# Patient Record
Sex: Male | Born: 1945 | Race: Black or African American | Hispanic: No | Marital: Married | Smoking: Former smoker
Health system: Southern US, Community
[De-identification: ages and names within clinical notes are randomized; demographics above are authoritative.]

## PROBLEM LIST (undated history)

## (undated) DIAGNOSIS — R011 Cardiac murmur, unspecified: Secondary | ICD-10-CM

## (undated) DIAGNOSIS — E785 Hyperlipidemia, unspecified: Secondary | ICD-10-CM

## (undated) DIAGNOSIS — I739 Peripheral vascular disease, unspecified: Secondary | ICD-10-CM

## (undated) DIAGNOSIS — N183 Chronic kidney disease, stage 3 unspecified: Secondary | ICD-10-CM

## (undated) DIAGNOSIS — I10 Essential (primary) hypertension: Secondary | ICD-10-CM

## (undated) DIAGNOSIS — I350 Nonrheumatic aortic (valve) stenosis: Secondary | ICD-10-CM

## (undated) HISTORY — PX: NECK SURGERY: SHX720

## (undated) HISTORY — PX: COLONOSCOPY: SHX174

## (undated) HISTORY — PX: FEMORAL BYPASS: SHX50

## (undated) HISTORY — PX: BLEPHAROPLASTY: SUR158

---

## 2007-12-31 ENCOUNTER — Inpatient Hospital Stay: Payer: Self-pay | Admitting: Internal Medicine

## 2012-01-22 DIAGNOSIS — I639 Cerebral infarction, unspecified: Secondary | ICD-10-CM

## 2012-01-22 HISTORY — DX: Cerebral infarction, unspecified: I63.9

## 2012-08-31 ENCOUNTER — Emergency Department: Payer: Self-pay | Admitting: Emergency Medicine

## 2012-08-31 LAB — COMPREHENSIVE METABOLIC PANEL
Albumin: 3.6 g/dL (ref 3.4–5.0)
Alkaline Phosphatase: 119 U/L (ref 50–136)
BUN: 8 mg/dL (ref 7–18)
Bilirubin,Total: 0.3 mg/dL (ref 0.2–1.0)
Chloride: 106 mmol/L (ref 98–107)
Co2: 26 mmol/L (ref 21–32)
EGFR (African American): >60
Osmolality: 272 (ref 275–301)
Potassium: 3.8 mmol/L (ref 3.5–5.1)
SGOT(AST): 17 U/L (ref 15–37)
Total Protein: 7.3 g/dL (ref 6.4–8.2)

## 2012-08-31 LAB — URINALYSIS, COMPLETE
Bilirubin,UR: NEGATIVE
Nitrite: NEGATIVE
Protein: NEGATIVE
RBC,UR: 2 /HPF (ref 0–5)
Specific Gravity: 1.018 (ref 1.003–1.030)
Squamous Epithelial: <1
WBC UR: <1 /HPF (ref 0–5)

## 2012-08-31 LAB — CBC
HCT: 44.5 % (ref 40.0–52.0)
MCH: 28.8 pg (ref 26.0–34.0)
MCV: 84 fL (ref 80–100)
WBC: 9.9 10*3/uL (ref 3.8–10.6)

## 2012-08-31 LAB — TROPONIN I: Troponin-I: <0.02 ng/mL

## 2012-11-06 ENCOUNTER — Emergency Department: Payer: Self-pay | Admitting: Emergency Medicine

## 2016-10-17 ENCOUNTER — Other Ambulatory Visit: Payer: Self-pay | Admitting: Ophthalmology

## 2016-10-17 ENCOUNTER — Other Ambulatory Visit: Payer: Self-pay

## 2016-10-17 DIAGNOSIS — H04031 Chronic enlargement of right lacrimal gland: Secondary | ICD-10-CM

## 2016-10-24 ENCOUNTER — Ambulatory Visit
Admission: RE | Admit: 2016-10-24 | Discharge: 2016-10-24 | Disposition: A | Discharge status: Home / Self Care | Payer: Medicare Other | Source: Ambulatory Visit | Attending: Ophthalmology | Admitting: Ophthalmology

## 2016-10-24 DIAGNOSIS — H04031 Chronic enlargement of right lacrimal gland: Secondary | ICD-10-CM | POA: Insufficient documentation

## 2016-10-24 DIAGNOSIS — I63532 Cerebral infarction due to unspecified occlusion or stenosis of left posterior cerebral artery: Secondary | ICD-10-CM | POA: Diagnosis not present

## 2016-10-24 LAB — POCT I-STAT CREATININE: CREATININE: 1.4 mg/dL — AB (ref 0.61–1.24)

## 2016-10-24 MED ORDER — GADOBENATE DIMEGLUMINE 529 MG/ML IV SOLN
18.0000 mL | Freq: Once | INTRAVENOUS | Status: AC | PRN
  Administered 2016-10-24: 18 mL via INTRAVENOUS

## 2018-08-07 IMAGING — MR MR ORBITS WO/W CM
4 of 7 series · 19 of 48 positions shown · IV contrast (18mL MULTIHANCE)
Comparison: Brain MRI 08/31/2012.

CLINICAL DATA: 71-year-old male with right side enlarged lacrimal
gland. I drainage. Suspected tumor.

EXAM:
MRI OF THE ORBITS WITHOUT AND WITH CONTRAST
TECHNIQUE: Multiplanar, multisequence MR imaging of the orbits was performed
both before and after the administration of intravenous contrast.
CONTRAST:  18mL MULTIHANCE GADOBENATE DIMEGLUMINE 529 MG/ML IV SOLN

[Series 2: T1 · sagittal · 5.0mm · 0.45mm/px · 3 of 25 slices shown (1 of 2)]
[im 5/25]
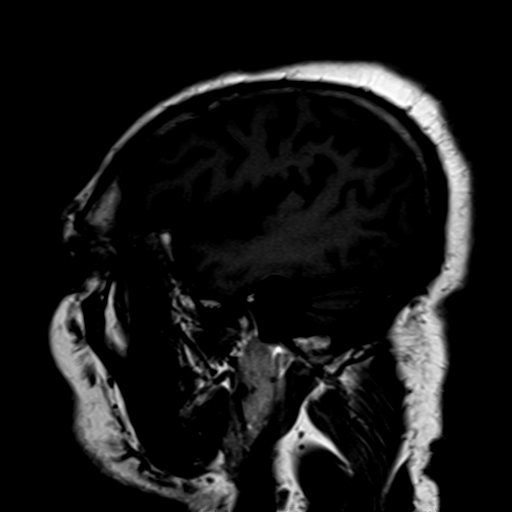
[im 13/25]
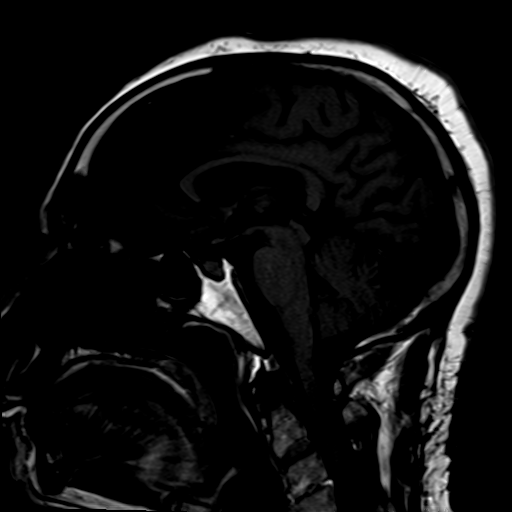
[im 21/25]
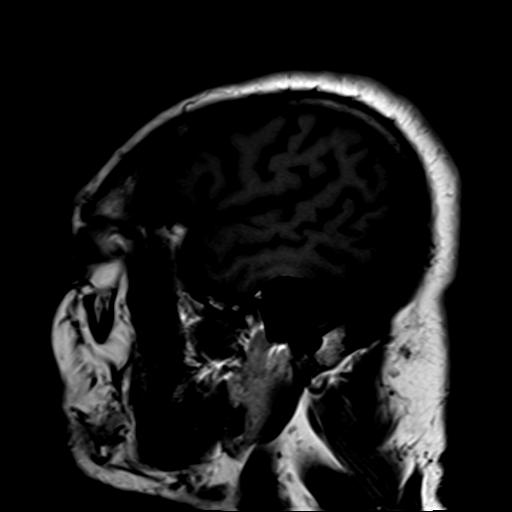

[Series 4: T2 fat-sat · coronal · 3.0mm · 0.35mm/px · 7 of 23 slices shown (1 of 2)]
[im 1/23]
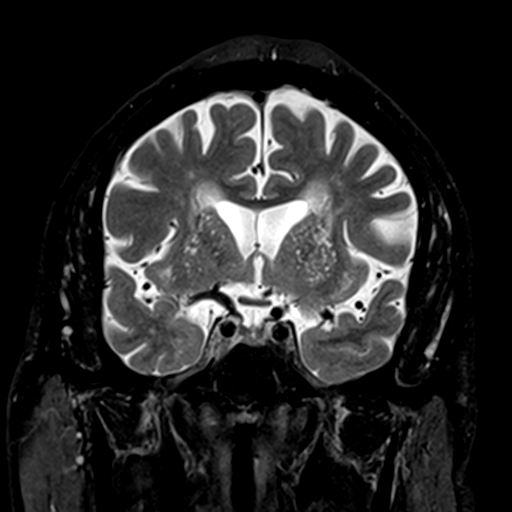
[im 4/23]
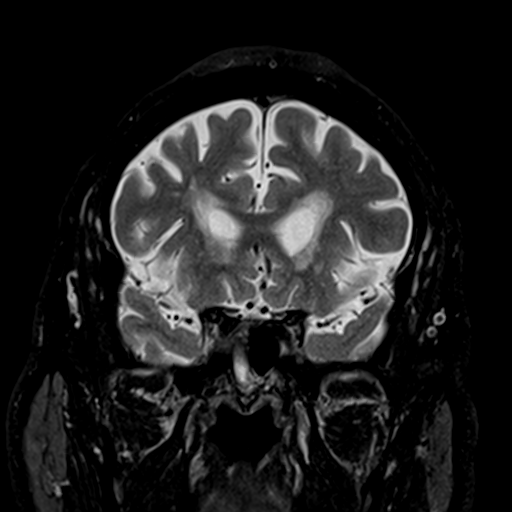
[im 8/23]
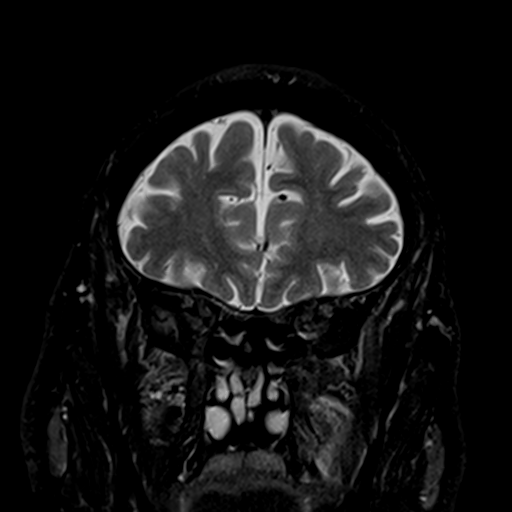
[im 12/23]
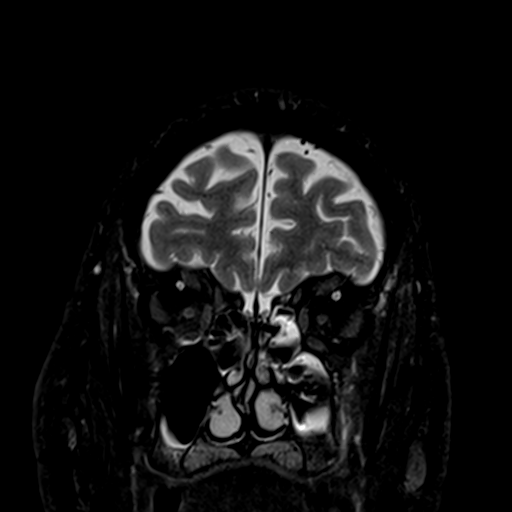
[im 15/23]
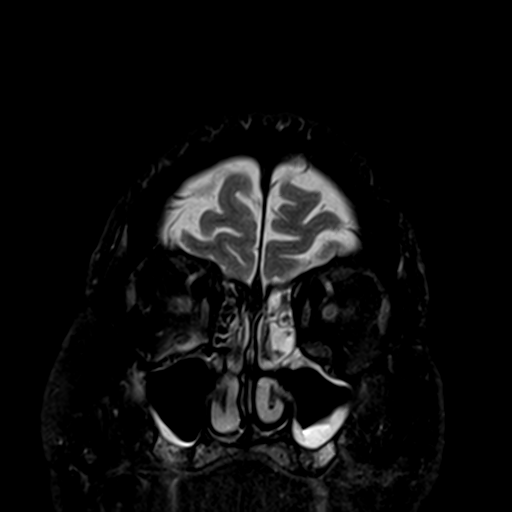
[im 19/23]
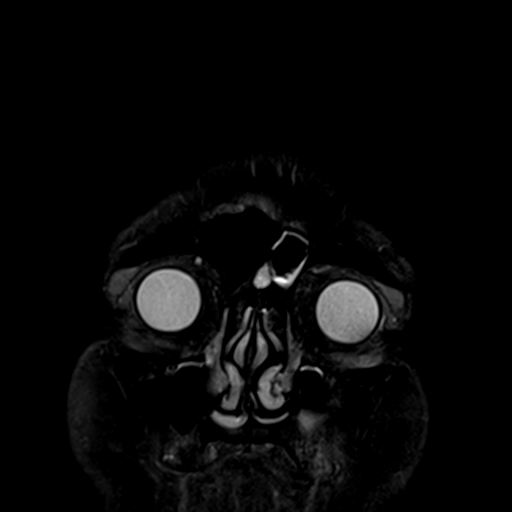
[im 23/23]
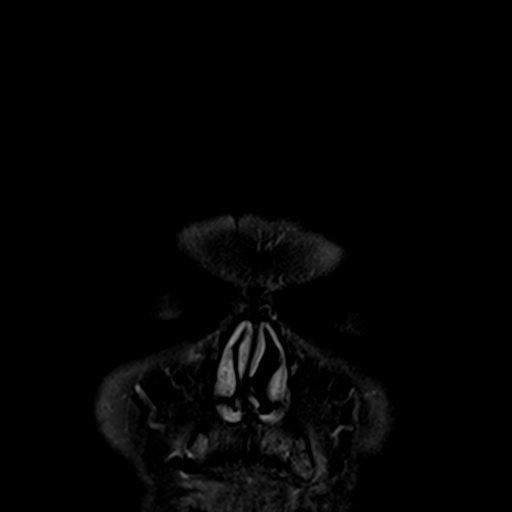

[Series 6: T1 · coronal · 3.0mm · 0.39mm/px · 3 of 25 slices shown (2 of 2)]
[im 4/25]
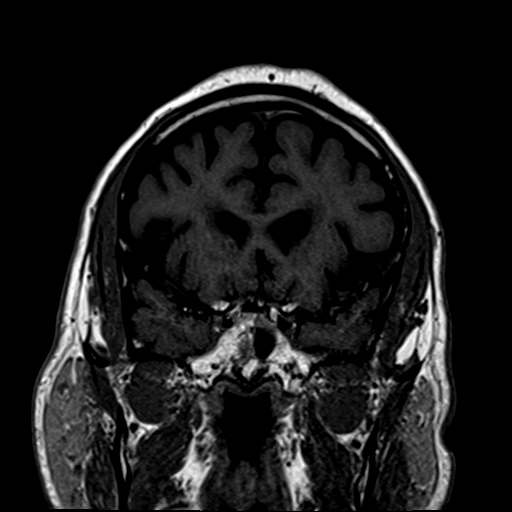
[im 14/25]
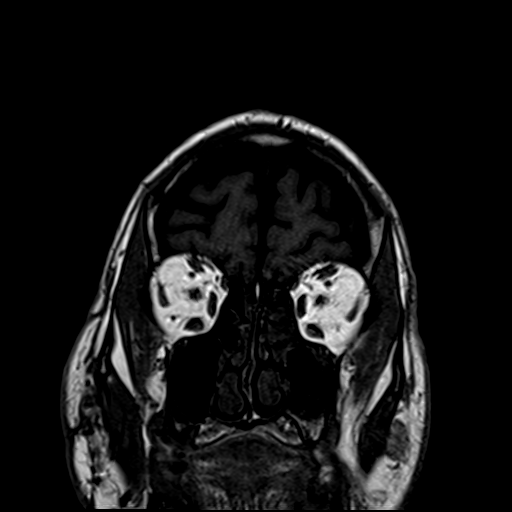
[im 21/25]
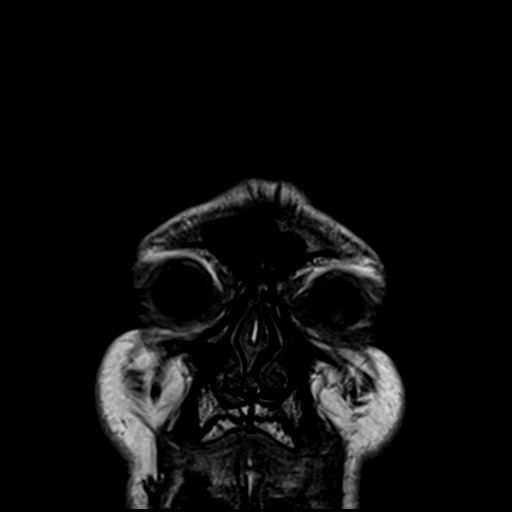

[Series 8: T2 fat-sat · axial · 3.0mm · 0.35mm/px · z∈[-51,+8]mm · 6 of 19 slices shown (2 of 2)]
[im 1/19]
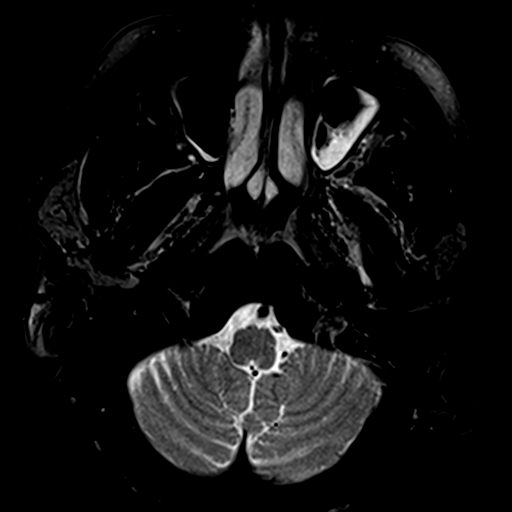
[im 4/19]
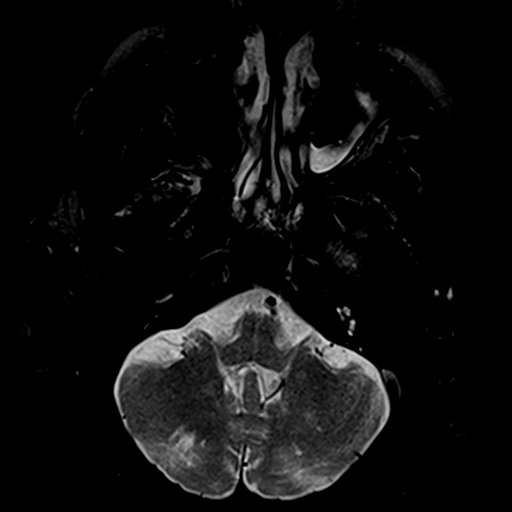
[im 8/19]
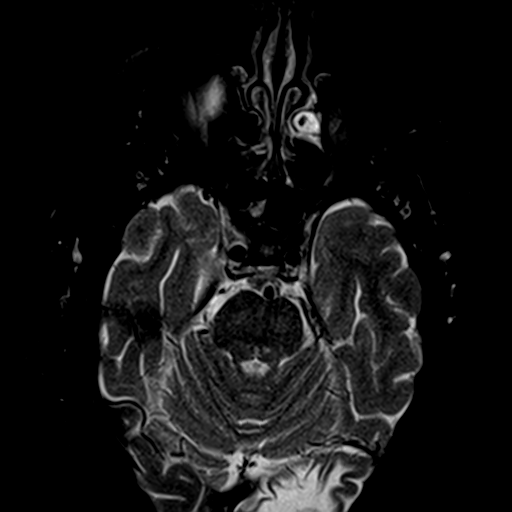
[im 11/19]
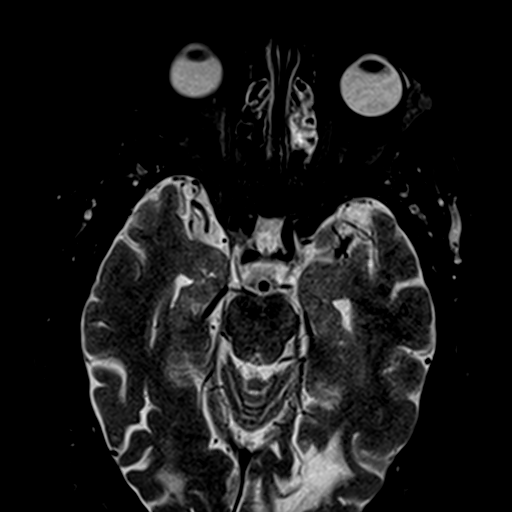
[im 15/19]
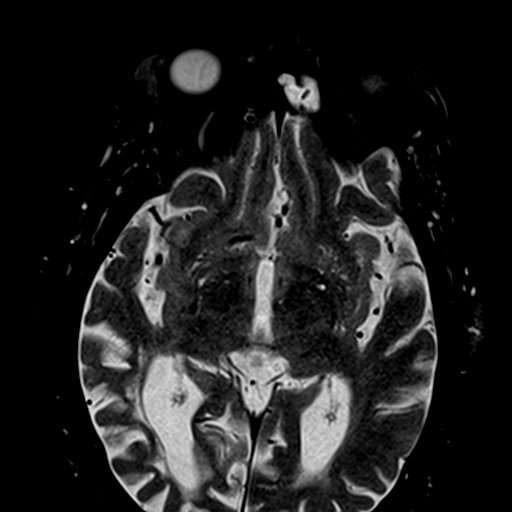
[im 19/19]
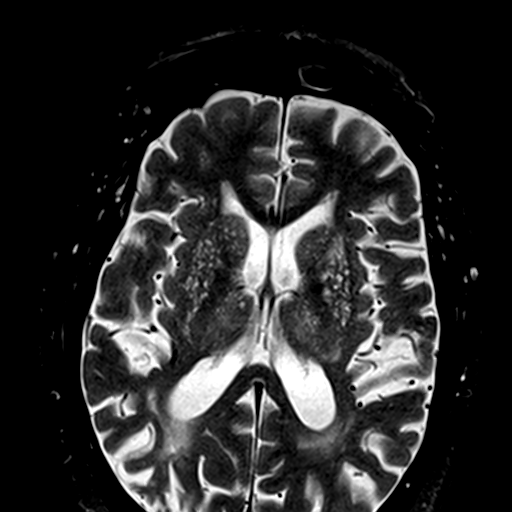

[19 of 48 positions shown; findings below may reference images not displayed]

FINDINGS: Both lacrimal glands appear mildly enlarged, but also appear
symmetric (series 8, image 14 on the right and image 13 on the left)
and similar in size and configuration to that visible on the brain
MRI of 0102. There is no periorbital or intraorbital inflammation or
edema. The globes appear symmetric and within normal limits.

Following contrast bilateral lacrimal gland enhancement is symmetric
and within normal limits. The extraocular muscles and optic nerves
also appear symmetric and normal. No abnormal enhancement
identified.

Optic chiasm, visible pituitary gland, suprasellar cistern, and
cavernous sinus appear normal. Major intracranial vascular flow
voids appear stable since 0102.

Stable visualized brain parenchyma since 0102, including left
occipital lobe encephalomalacia and small chronic infarcts in both
cerebellar hemispheres. No dural thickening.

Partially visible cervical spine postoperative changes. Normal bone
marrow signal. Moderate left greater than right paranasal sinus
mucosal thickening is new since 0102. Visible scalp and face soft
tissues appear negative.
IMPRESSION: 1. The lacrimal glands demonstrate mild to moderate symmetric
enlargement, and probably have not significantly changed since 0102.
Lacrimal gland enhancement is within normal limits.
2. No orbital tumor or acute inflammation.
3. Stable visualized brain including chronic left PCA territory
infarct.

## 2019-11-24 ENCOUNTER — Encounter: Payer: Self-pay | Admitting: Ophthalmology

## 2019-11-24 ENCOUNTER — Other Ambulatory Visit: Payer: Self-pay

## 2019-11-25 ENCOUNTER — Other Ambulatory Visit
Admission: RE | Admit: 2019-11-25 | Discharge: 2019-11-25 | Disposition: A | Discharge status: Home / Self Care | Payer: Medicare Other | Source: Ambulatory Visit | Attending: Ophthalmology | Admitting: Ophthalmology

## 2019-11-25 DIAGNOSIS — Z20822 Contact with and (suspected) exposure to covid-19: Secondary | ICD-10-CM | POA: Insufficient documentation

## 2019-11-25 DIAGNOSIS — Z01812 Encounter for preprocedural laboratory examination: Secondary | ICD-10-CM | POA: Diagnosis present

## 2019-11-25 LAB — SARS CORONAVIRUS 2 (TAT 6-24 HRS): SARS Coronavirus 2: NEGATIVE

## 2019-11-25 NOTE — Discharge Instructions (Signed)

## 2019-11-29 ENCOUNTER — Encounter: Payer: Self-pay | Admitting: Ophthalmology

## 2019-11-29 ENCOUNTER — Other Ambulatory Visit: Payer: Self-pay

## 2019-11-29 ENCOUNTER — Ambulatory Visit
Admission: RE | Admit: 2019-11-29 | Discharge: 2019-11-29 | Disposition: A | Discharge status: Home / Self Care | Payer: Medicare Other | Attending: Ophthalmology | Admitting: Ophthalmology

## 2019-11-29 ENCOUNTER — Encounter
Admission: RE | Disposition: A | Discharge status: Home / Self Care | Payer: Self-pay | Source: Home / Self Care | Attending: Ophthalmology

## 2019-11-29 ENCOUNTER — Ambulatory Visit: Payer: Medicare Other | Admitting: Anesthesiology

## 2019-11-29 DIAGNOSIS — Z87891 Personal history of nicotine dependence: Secondary | ICD-10-CM | POA: Diagnosis not present

## 2019-11-29 DIAGNOSIS — H2511 Age-related nuclear cataract, right eye: Secondary | ICD-10-CM | POA: Diagnosis present

## 2019-11-29 DIAGNOSIS — H401111 Primary open-angle glaucoma, right eye, mild stage: Secondary | ICD-10-CM | POA: Insufficient documentation

## 2019-11-29 DIAGNOSIS — Z7982 Long term (current) use of aspirin: Secondary | ICD-10-CM | POA: Diagnosis not present

## 2019-11-29 DIAGNOSIS — Z79899 Other long term (current) drug therapy: Secondary | ICD-10-CM | POA: Diagnosis not present

## 2019-11-29 HISTORY — PX: CATARACT EXTRACTION W/PHACO: SHX586

## 2019-11-29 HISTORY — DX: Chronic kidney disease, stage 3 unspecified: N18.30

## 2019-11-29 HISTORY — DX: Hyperlipidemia, unspecified: E78.5

## 2019-11-29 HISTORY — DX: Nonrheumatic aortic (valve) stenosis: I35.0

## 2019-11-29 HISTORY — DX: Cardiac murmur, unspecified: R01.1

## 2019-11-29 HISTORY — DX: Peripheral vascular disease, unspecified: I73.9

## 2019-11-29 HISTORY — DX: Essential (primary) hypertension: I10

## 2019-11-29 SURGERY — PHACOEMULSIFICATION, CATARACT, WITH IOL INSERTION
Anesthesia: Monitor Anesthesia Care | Site: Eye | Laterality: Right

## 2019-11-29 MED ORDER — ACETAMINOPHEN 160 MG/5ML PO SOLN: 325.0000 mg | Freq: Once | ORAL | Status: DC

## 2019-11-29 MED ORDER — SODIUM HYALURONATE 23 MG/ML IO SOLN
INTRAOCULAR | Status: DC | PRN
  Administered 2019-11-29: 0.6 mL via INTRAOCULAR

## 2019-11-29 MED ORDER — LACTATED RINGERS IV SOLN: INTRAVENOUS | Status: DC

## 2019-11-29 MED ORDER — FENTANYL CITRATE (PF) 100 MCG/2ML IJ SOLN
INTRAMUSCULAR | Status: DC | PRN
  Administered 2019-11-29: 25 ug via INTRAVENOUS

## 2019-11-29 MED ORDER — TETRACAINE HCL 0.5 % OP SOLN
1.0000 [drp] | OPHTHALMIC | Status: DC | PRN
  Administered 2019-11-29 (×3): 1 [drp] via OPHTHALMIC

## 2019-11-29 MED ORDER — EPINEPHRINE PF 1 MG/ML IJ SOLN
INTRAOCULAR | Status: DC | PRN
  Administered 2019-11-29: 83 mL via OPHTHALMIC

## 2019-11-29 MED ORDER — LIDOCAINE HCL (PF) 2 % IJ SOLN
INTRAOCULAR | Status: DC | PRN
  Administered 2019-11-29: 1 mL via INTRAOCULAR

## 2019-11-29 MED ORDER — MOXIFLOXACIN HCL 0.5 % OP SOLN
OPHTHALMIC | Status: DC | PRN
  Administered 2019-11-29: 0.2 mL via OPHTHALMIC

## 2019-11-29 MED ORDER — MIDAZOLAM HCL 2 MG/2ML IJ SOLN
INTRAMUSCULAR | Status: DC | PRN
  Administered 2019-11-29: 1 mg via INTRAVENOUS

## 2019-11-29 MED ORDER — ARMC OPHTHALMIC DILATING DROPS
1.0000 "application " | OPHTHALMIC | Status: DC | PRN
  Administered 2019-11-29 (×3): 1 via OPHTHALMIC

## 2019-11-29 MED ORDER — ACETAMINOPHEN 325 MG PO TABS: 325.0000 mg | ORAL_TABLET | Freq: Once | ORAL | Status: DC

## 2019-11-29 MED ORDER — SODIUM HYALURONATE 10 MG/ML IO SOLN
INTRAOCULAR | Status: DC | PRN
  Administered 2019-11-29 (×2): 0.55 mL via INTRAOCULAR

## 2019-11-29 SURGICAL SUPPLY — 22 items
CANNULA ANT/CHMB 27G (MISCELLANEOUS) ×2 IMPLANT
CANNULA ANT/CHMB 27GA (MISCELLANEOUS) ×6 IMPLANT
DEVICE INJECT ISTENT W (Stent) IMPLANT
DISSECTOR HYDRO NUCLEUS 50X22 (MISCELLANEOUS) ×3 IMPLANT
GLOVE SURG LX 7.5 STRW (GLOVE) ×2
GLOVE SURG LX STRL 7.5 STRW (GLOVE) ×1 IMPLANT
GLOVE SURG SYN 8.5  E (GLOVE) ×2
GLOVE SURG SYN 8.5 E (GLOVE) ×1 IMPLANT
GLOVE SURG SYN 8.5 PF PI (GLOVE) ×1 IMPLANT
GOWN STRL REUS W/ TWL LRG LVL3 (GOWN DISPOSABLE) ×2 IMPLANT
GOWN STRL REUS W/TWL LRG LVL3 (GOWN DISPOSABLE) ×6
INJECT ISTENT W (Stent) ×3 IMPLANT
KIT CUTTING IOL (CUTTING FORCEPS) ×2 IMPLANT
LENS IOL TECNIS EYHANCE 20.0 (Intraocular Lens) ×2 IMPLANT
MARKER SKIN DUAL TIP RULER LAB (MISCELLANEOUS) ×3 IMPLANT
PACK DR. KING ARMS (PACKS) ×3 IMPLANT
PACK EYE AFTER SURG (MISCELLANEOUS) ×3 IMPLANT
PACK OPTHALMIC (MISCELLANEOUS) ×3 IMPLANT
SYR 3ML LL SCALE MARK (SYRINGE) ×3 IMPLANT
SYR TB 1ML LUER SLIP (SYRINGE) ×3 IMPLANT
WATER STERILE IRR 250ML POUR (IV SOLUTION) ×3 IMPLANT
WIPE NON LINTING 3.25X3.25 (MISCELLANEOUS) ×3 IMPLANT

## 2019-11-29 NOTE — Anesthesia Postprocedure Evaluation (Signed)
Anesthesia Post Note  Patient: Corporate treasurer  Procedure(s) Performed: CATARACT EXTRACTION PHACO AND INTRAOCULAR LENS PLACEMENT (IOC) RIGHT ISTENT INJ 3.51 00:34.6 (Right Eye)     Patient location during evaluation: PACU Anesthesia Type: MAC Level of consciousness: awake and alert and oriented Pain management: satisfactory to patient Vital Signs Assessment: post-procedure vital signs reviewed and stable Respiratory status: spontaneous breathing, nonlabored ventilation and respiratory function stable Cardiovascular status: blood pressure returned to baseline and stable Postop Assessment: Adequate PO intake and No signs of nausea or vomiting Anesthetic complications: no   No complications documented.  Raliegh Ip

## 2019-11-29 NOTE — Transfer of Care (Signed)
Immediate Anesthesia Transfer of Care Note  Patient: Scott Miller  Procedure(s) Performed: CATARACT EXTRACTION PHACO AND INTRAOCULAR LENS PLACEMENT (IOC) RIGHT ISTENT INJ 3.51 00:34.6 (Right Eye)  Patient Location: PACU  Anesthesia Type: MAC  Level of Consciousness: awake, alert  and patient cooperative  Airway and Oxygen Therapy: Patient Spontanous Breathing and Patient connected to supplemental oxygen  Post-op Assessment: Post-op Vital signs reviewed, Patient's Cardiovascular Status Stable, Respiratory Function Stable, Patent Airway and No signs of Nausea or vomiting  Post-op Vital Signs: Reviewed and stable  Complications: No complications documented.

## 2019-11-29 NOTE — Anesthesia Procedure Notes (Signed)
Date/Time: 11/29/2019 10:47 AM Performed by: Lily Kocher, CRNA Pre-anesthesia Checklist: Patient identified, Emergency Drugs available, Suction available, Patient being monitored and Timeout performed Oxygen Delivery Method: Nasal cannula Placement Confirmation: positive ETCO2

## 2019-11-29 NOTE — H&P (Signed)
[facility]   Primary Care Physician:  Garry Heater, MD Ophthalmologist: Dr. Willey Blade  Pre-Procedure History & Physical: HPI:  Scott Miller is a 74 y.o. male here for cataract surgery.   Past Medical History:  Diagnosis Date  . Aortic stenosis   . CKD (chronic kidney disease), stage III (HCC)   . Heart murmur   . Hyperlipidemia   . Hypertension   . Peripheral arterial disease (HCC)   . Stroke [facility]) 2014    Past Surgical History:  Procedure Laterality Date  . BLEPHAROPLASTY    . COLONOSCOPY    . FEMORAL BYPASS Left   . NECK SURGERY      Prior to Admission medications   Medication Sig Start Date End Date Taking? Authorizing Provider  amLODipine (NORVASC) 5 MG tablet Take 5 mg by mouth daily.   Yes [provider]  ASPIRIN 81 PO Take by mouth daily.   Yes [provider]  atorvastatin (LIPITOR) 80 MG tablet Take 80 mg by mouth daily.   Yes [provider]  carvedilol (COREG) 6.25 MG tablet Take 6.25 mg by mouth 2 (two) times daily with a meal.   Yes [provider]  Cholecalciferol (VITAMIN D) 125 MCG (5000 UT) CAPS Take by mouth daily.   Yes [provider]  cilostazol (PLETAL) 100 MG tablet Take 100 mg by mouth 2 (two) times daily.   Yes [provider]  losartan (COZAAR) 100 MG tablet Take 100 mg by mouth daily.   Yes [provider]    Allergies as of 11/01/2019  . (Not on File)    History reviewed. No pertinent family history.  Social History   Socioeconomic History  . Marital status: Married    Spouse name: Not on file  . Number of children: Not on file  . Years of education: Not on file  . Highest education level: Not on file  Occupational History  . Not on file  Tobacco Use  . Smoking status: Former Smoker    Types: Cigarettes    Quit date: 2014    Years since quitting: 7.8  . Smokeless tobacco: Never Used  Vaping Use  . Vaping Use: Never used  Substance and Sexual  Activity  . Alcohol use: Not Currently  . Drug use: Not on file  . Sexual activity: Not on file  Other Topics Concern  . Not on file  Social History Narrative  . Not on file   Social Determinants of Health   Financial Resource Strain:   . Difficulty of Paying Living Expenses: Not on file  Food Insecurity:   . Worried About Programme researcher, broadcasting/film/video in the Last Year: Not on file  . Ran Out of Food in the Last Year: Not on file  Transportation Needs:   . Lack of Transportation (Medical): Not on file  . Lack of Transportation (Non-Medical): Not on file  Physical Activity:   . Days of Exercise per Week: Not on file  . Minutes of Exercise per Session: Not on file  Stress:   . Feeling of Stress : Not on file  Social Connections:   . Frequency of Communication with Friends and Family: Not on file  . Frequency of Social Gatherings with Friends and Family: Not on file  . Attends Religious Services: Not on file  . Active Member of Clubs or Organizations: Not on file  . Attends Banker Meetings: Not on file  . Marital Status: Not on  file  Intimate Partner Violence:   . Fear of Current or Ex-Partner: Not on file  . Emotionally Abused: Not on file  . Physically Abused: Not on file  . Sexually Abused: Not on file    Review of Systems: See HPI, otherwise negative ROS  Physical Exam: BP (!) 173/85   Pulse 76   Temp (!) 97 F (36.1 C) (Temporal)   Resp 16   Ht 5\' 9"  (1.753 m)   Wt 93 kg   SpO2 94%   BMI 30.27 kg/m  General:   Alert,  pleasant and cooperative in NAD Head:  Normocephalic and atraumatic. Respiratory:  Normal work of breathing.  Impression/Plan: is here for cataract surgery.  Risks, benefits, limitations, and alternatives regarding cataract surgery have been reviewed with the patient.  Questions have been answered.  All parties agreeable.   [name], MD  11/29/2019, 10:37 AM

## 2019-11-29 NOTE — Anesthesia Preprocedure Evaluation (Signed)
Anesthesia Evaluation  Patient identified by MRN, date of birth, ID band Patient awake    Reviewed: Allergy & Precautions, H&P , NPO status , Patient's Chart, lab work & pertinent test results  Airway Mallampati: III  TM Distance: >3 FB Neck ROM: full    Dental  (+) Edentulous Upper, Missing, Chipped, Poor Dentition   Pulmonary former smoker,    Pulmonary exam normal breath sounds clear to auscultation       Cardiovascular hypertension, + Peripheral Vascular Disease  + Valvular Problems/Murmurs AS  Rhythm:regular Rate:Normal + Systolic murmurs Last Echo showed severe AS.  Monitoring progression.   Neuro/Psych CVA    GI/Hepatic   Endo/Other    Renal/GU Renal disease     Musculoskeletal   Abdominal   Peds  Hematology   Anesthesia Other Findings   Reproductive/Obstetrics                             Anesthesia Physical Anesthesia Plan  ASA: IV  Anesthesia Plan: MAC   Post-op Pain Management:    Induction:   PONV Risk Score and Plan: 1 and Treatment may vary due to age or medical condition, TIVA and Midazolam  Airway Management Planned:   Additional Equipment:   Intra-op Plan:   Post-operative Plan:   Informed Consent: I have reviewed the patients History and Physical, chart, labs and discussed the procedure including the risks, benefits and alternatives for the proposed anesthesia with the patient or authorized representative who has indicated his/her understanding and acceptance.     Dental Advisory Given  Plan Discussed with: CRNA  Anesthesia Plan Comments:         Anesthesia Quick Evaluation

## 2019-11-29 NOTE — Op Note (Signed)
OPERATIVE NOTE  Scott Miller [id number] 11/29/2019  PREOPERATIVE DIAGNOSIS:   1.  Mild PRIMARY open angle glaucoma, right eye. H40.1111 2.  Nuclear sclerotic cataract right eye.  H25.11   POSTOPERATIVE DIAGNOSIS:    same.   PROCEDURE:   1.  Placement of trabecular bypass stent (istent). CPT 0191T  and placement of additional stent  CPT 0376T 2.  Phacoemusification with posterior chamber intraocular lens placement of the right eye  CPT [phone]   LENS: Implant Name Type Inv. Item Serial No. Manufacturer Lot No. LRB No. Used Action  Marko Stai - [id number] Stent Marko Stai [id number] [employer]  Right 1 Implanted  LENS IOL TECNIS EYHANCE 20.0 - [id number] Intraocular Lens LENS IOL TECNIS EYHANCE 20.0 [id number] J[employer]ON   Right 1 Implanted      Procedure(s): CATARACT EXTRACTION PHACO AND INTRAOCULAR LENS PLACEMENT (IOC) RIGHT ISTENT INJ 3.51 00:34.6 (Right)  DIB00 +20.0   ULTRASOUND TIME: 0 minutes 34 seconds.  CDE 3.51   SURGEON:  Benay Pillow, MD, MPH  ANESTHESIOLOGIST: Anesthesiologist: Ronelle Nigh, MD CRNA: Dionne Bucy, CRNA   ANESTHESIA:  MAC and intracameral preservative-free lidocaine 4%.  ESTIMATED BLOOD LOSS: less than 1 mL.   COMPLICATIONS:  None.   DESCRIPTION OF PROCEDURE:  The patient was identified in the holding room and transported to the operating room.  The patient was placed in the supine position under the operating microscope.  The right eye was prepped and draped in the usual sterile ophthalmic fashion.   A 1.0 millimeter clear-corneal paracentesis was made at the 10:30 position. 0.5 ml of preservative-free 1% lidocaine with epinephrine was injected into the anterior chamber.  The anterior chamber was filled with Healon 5 viscoelastic.  A 2.4 millimeter keratome was used to make a near-clear corneal incision at the 8:00 position.   Attention was turned to the istent.  The patients head was turned to the left and the microscope  was tilted to 035 degrees.  Ocular instruments/Glaukos OAL/H2 gonioprism was used with IPC05 (iclip) coupled with Healon 5 on the cornea was used to visualize the trabecular meshwork. The istent was opened and introduced into the eye.  The meshwork was engaged with the tip of the iStent injector and the stent was deployed into Schlemm's canal at 2:00.  The second stent was deployed at 4:00.  The stents were well seated and in good position.  Next, attention was turned to the phacoemulsification A curvilinear capsulorrhexis was made with a cystotome and capsulorrhexis forceps.  Balanced salt solution was used to hydrodissect and hydrodelineate the nucleus.   Phacoemulsification was then used in stop and chop fashion to remove the lens nucleus and epinucleus.  The remaining cortex was then removed using the irrigation and aspiration handpiece. Healon was then placed into the capsular bag to distend it for lens placement.  A lens was then injected into the capsular bag.  The remaining viscoelastic was aspirated.   Wounds were hydrated with balanced salt solution.  The anterior chamber was inflated to a physiologic pressure with balanced salt solution.   There was significant heme in the eye from the istents.  Additional irrigation of the angle was performed, but this dislodged one of the istents.   Additional helaon was injected into the eye and duet micrograsping forceps were used to remove the istent inject without difficulty.  Due to the ongoing bleeding with compromised view, the decision was made to not pursue replacing the second istent inject.   Intracameral vigamox 0.1  mL undiluted was injected into the eye and a drop placed onto the ocular surface.  No wound leaks were noted. The patient was taken to the recovery room in stable condition.  The second stent being removed and the bleeding were disclosed to the patient.  Benay Pillow 11/29/2019, 11:23 AM

## 2019-11-30 ENCOUNTER — Encounter: Payer: Self-pay | Admitting: Ophthalmology

## 2020-01-19 ENCOUNTER — Encounter: Payer: Self-pay | Admitting: Ophthalmology

## 2020-01-19 ENCOUNTER — Other Ambulatory Visit: Payer: Self-pay

## 2020-01-27 ENCOUNTER — Other Ambulatory Visit
Admission: RE | Admit: 2020-01-27 | Discharge: 2020-01-27 | Disposition: A | Discharge status: Home / Self Care | Payer: Medicare Other | Source: Ambulatory Visit | Attending: Ophthalmology | Admitting: Ophthalmology

## 2020-01-27 ENCOUNTER — Other Ambulatory Visit: Payer: Self-pay

## 2020-01-27 DIAGNOSIS — Z20822 Contact with and (suspected) exposure to covid-19: Secondary | ICD-10-CM | POA: Insufficient documentation

## 2020-01-27 DIAGNOSIS — Z01812 Encounter for preprocedural laboratory examination: Secondary | ICD-10-CM | POA: Diagnosis present

## 2020-01-27 LAB — SARS CORONAVIRUS 2 (TAT 6-24 HRS): SARS Coronavirus 2: NEGATIVE

## 2020-01-27 NOTE — Discharge Instructions (Signed)

## 2020-01-31 ENCOUNTER — Ambulatory Visit: Payer: Medicare Other | Admitting: Anesthesiology

## 2020-01-31 ENCOUNTER — Encounter: Payer: Self-pay | Admitting: Ophthalmology

## 2020-01-31 ENCOUNTER — Other Ambulatory Visit: Payer: Self-pay

## 2020-01-31 ENCOUNTER — Ambulatory Visit
Admission: RE | Admit: 2020-01-31 | Discharge: 2020-01-31 | Disposition: A | Discharge status: Home / Self Care | Payer: Medicare Other | Attending: Ophthalmology | Admitting: Ophthalmology

## 2020-01-31 ENCOUNTER — Encounter
Admission: RE | Disposition: A | Discharge status: Home / Self Care | Payer: Self-pay | Source: Home / Self Care | Attending: Ophthalmology

## 2020-01-31 DIAGNOSIS — Z91041 Radiographic dye allergy status: Secondary | ICD-10-CM | POA: Diagnosis not present

## 2020-01-31 DIAGNOSIS — Z79899 Other long term (current) drug therapy: Secondary | ICD-10-CM | POA: Diagnosis not present

## 2020-01-31 DIAGNOSIS — H401121 Primary open-angle glaucoma, left eye, mild stage: Secondary | ICD-10-CM | POA: Diagnosis present

## 2020-01-31 DIAGNOSIS — Z8673 Personal history of transient ischemic attack (TIA), and cerebral infarction without residual deficits: Secondary | ICD-10-CM | POA: Insufficient documentation

## 2020-01-31 DIAGNOSIS — H2512 Age-related nuclear cataract, left eye: Secondary | ICD-10-CM | POA: Diagnosis not present

## 2020-01-31 DIAGNOSIS — Z87891 Personal history of nicotine dependence: Secondary | ICD-10-CM | POA: Insufficient documentation

## 2020-01-31 DIAGNOSIS — Z7982 Long term (current) use of aspirin: Secondary | ICD-10-CM | POA: Insufficient documentation

## 2020-01-31 HISTORY — PX: CATARACT EXTRACTION W/PHACO: SHX586

## 2020-01-31 SURGERY — PHACOEMULSIFICATION, CATARACT, WITH IOL INSERTION
Anesthesia: Monitor Anesthesia Care | Site: Eye | Laterality: Left

## 2020-01-31 MED ORDER — SODIUM HYALURONATE 10 MG/ML IO SOLN
INTRAOCULAR | Status: DC | PRN
  Administered 2020-01-31: 0.55 mL via INTRAOCULAR

## 2020-01-31 MED ORDER — ACETAMINOPHEN 325 MG PO TABS: 325.0000 mg | ORAL_TABLET | Freq: Once | ORAL | Status: DC

## 2020-01-31 MED ORDER — FENTANYL CITRATE (PF) 100 MCG/2ML IJ SOLN
INTRAMUSCULAR | Status: DC | PRN
  Administered 2020-01-31: 50 ug via INTRAVENOUS

## 2020-01-31 MED ORDER — ACETAMINOPHEN 160 MG/5ML PO SOLN: 325.0000 mg | Freq: Once | ORAL | Status: DC

## 2020-01-31 MED ORDER — MOXIFLOXACIN HCL 0.5 % OP SOLN
OPHTHALMIC | Status: DC | PRN
  Administered 2020-01-31: 0.2 mL via OPHTHALMIC

## 2020-01-31 MED ORDER — TETRACAINE HCL 0.5 % OP SOLN
1.0000 [drp] | OPHTHALMIC | Status: AC | PRN
  Administered 2020-01-31 (×3): 1 [drp] via OPHTHALMIC

## 2020-01-31 MED ORDER — ARMC OPHTHALMIC DILATING DROPS
1.0000 "application " | OPHTHALMIC | Status: DC | PRN
  Administered 2020-01-31 (×3): 1 via OPHTHALMIC

## 2020-01-31 MED ORDER — EPINEPHRINE PF 1 MG/ML IJ SOLN
INTRAOCULAR | Status: DC | PRN
  Administered 2020-01-31: 56 mL via OPHTHALMIC

## 2020-01-31 MED ORDER — MIDAZOLAM HCL 2 MG/2ML IJ SOLN
INTRAMUSCULAR | Status: DC | PRN
  Administered 2020-01-31: 1 mg via INTRAVENOUS

## 2020-01-31 MED ORDER — SODIUM HYALURONATE 23 MG/ML IO SOLN
INTRAOCULAR | Status: DC | PRN
  Administered 2020-01-31: 0.6 mL via INTRAOCULAR

## 2020-01-31 MED ORDER — LIDOCAINE HCL (PF) 2 % IJ SOLN
INTRAOCULAR | Status: DC | PRN
  Administered 2020-01-31: 1 mL via INTRAOCULAR

## 2020-01-31 MED ORDER — LACTATED RINGERS IV SOLN: INTRAVENOUS | Status: DC

## 2020-01-31 SURGICAL SUPPLY — 20 items
CANNULA ANT/CHMB 27GA (MISCELLANEOUS) ×4 IMPLANT
DEVICE INJECT ISTENT W (Stent) ×1 IMPLANT
DISSECTOR HYDRO NUCLEUS 50X22 (MISCELLANEOUS) ×2 IMPLANT
GLOVE SURG LX 7.5 STRW (GLOVE) ×2
GLOVE SURG LX STRL 7.5 STRW (GLOVE) ×2 IMPLANT
GLOVE SURG SYN 8.5  E (GLOVE) ×1
GLOVE SURG SYN 8.5 E (GLOVE) ×1 IMPLANT
GOWN STRL REUS W/ TWL LRG LVL3 (GOWN DISPOSABLE) ×2 IMPLANT
GOWN STRL REUS W/TWL LRG LVL3 (GOWN DISPOSABLE) ×4
ICLIP (OPHTHALMIC RELATED) ×2 IMPLANT
INJECT ISTENT W (Stent) ×2 IMPLANT
LENS IOL TECNIS EYHANCE 20.5 (Intraocular Lens) ×2 IMPLANT
MARKER SKIN DUAL TIP RULER LAB (MISCELLANEOUS) ×2 IMPLANT
PACK DR. KING ARMS (PACKS) ×2 IMPLANT
PACK EYE AFTER SURG (MISCELLANEOUS) ×2 IMPLANT
PACK OPTHALMIC (MISCELLANEOUS) ×2 IMPLANT
SYR 3ML LL SCALE MARK (SYRINGE) ×2 IMPLANT
SYR TB 1ML LUER SLIP (SYRINGE) ×2 IMPLANT
WATER STERILE IRR 250ML POUR (IV SOLUTION) ×2 IMPLANT
WIPE NON LINTING 3.25X3.25 (MISCELLANEOUS) ×2 IMPLANT

## 2020-01-31 NOTE — Op Note (Signed)
OPERATIVE NOTE  Raheel Kunkle [id number] 01/31/2020  PREOPERATIVE DIAGNOSIS:   1.  Mild  PRIMARY open angle glaucoma, left eye. F68.1275  2.  Nuclear sclerotic cataract left eye.  H25.12   POSTOPERATIVE DIAGNOSIS:    same.   PROCEDURE:   1.  Phacoemusification with posterior chamber intraocular lens placement of the left eye and placement of trabecular bypass surgery  CPT [phone]   LENS: Implant Name Type Inv. Item Serial No. Manufacturer Lot No. LRB No. Used Action  Marko Stai - [id number] Stent [employer] [id number] Left 1 Implanted  LENS IOL TECNIS EYHANCE 20.5 - [id number] Intraocular Lens LENS IOL TECNIS EYHANCE 20.5 [id number] [employer]   Left 1 Implanted      Procedure(s) with comments: CATARACT EXTRACTION PHACO AND INTRAOCULAR LENS PLACEMENT (IOC) LEFT ISTENT INJ (Left) - 3.45 0:29.9    ULTRASOUND TIME: 0 minutes 29 seconds.  CDE 3.45   SURGEON:  Benay Pillow, MD, MPH  ANESTHESIOLOGIST: Anesthesiologist: Ronelle Nigh, MD CRNA: Jeannene Patella, CRNA   ANESTHESIA:  MAC and intracameral preservative-free intracameral lidocaine 4%.  ESTIMATED BLOOD LOSS: less than 1 mL.   COMPLICATIONS:  None.   DESCRIPTION OF PROCEDURE:  The patient was identified in the holding room and transported to the operating room.  The patient was placed in the supine position under the operating microscope.  The left eye was prepped and draped in the usual sterile ophthalmic fashion.   A 1.0 millimeter clear-corneal paracentesis was made at the 4:30 position. 0.5 ml of preservative-free 1% lidocaine with epinephrine was injected into the anterior chamber.  The anterior chamber was filled with Healon 5 viscoelastic.  A 2.4 millimeter keratome was used to make a near-clear corneal incision at the 2:00 position.   Attention was turned to the istent.  The patients head was turned to the left and the microscope was tilted to 035 degrees.  Ocular instruments/Glaukos  OAL/H2 gonioprism was used with IPC05 (iclip) coupled with Healon 5 on the cornea was used to visualize the trabecular meshwork. The istent was opened and introduced into the eye.  The meshwork was engaged with the tip of the iStent injector and the stent was deployed into Schlemm's canal at 10:30.  The second stent was deployed at 8:00.  The stents were well seated and in good position.  Next, attention was turned to the phacoemulsification A curvilinear capsulorrhexis was made with a cystotome and capsulorrhexis forceps.  Balanced salt solution was used to hydrodissect and hydrodelineate the nucleus.   Phacoemulsification was then used in stop and chop fashion to remove the lens nucleus and epinucleus.  The remaining cortex was then removed using the irrigation and aspiration handpiece. Healon was then placed into the capsular bag to distend it for lens placement.  A lens was then injected into the capsular bag.  The remaining viscoelastic was aspirated.   Wounds were hydrated with balanced salt solution.  The anterior chamber was inflated to a physiologic pressure with balanced salt solution.   There was a small amount of heme reflux, indicating good position of the istents.  Intracameral vigamox 0.1 mL undiluted was injected into the eye and a drop placed onto the ocular surface.  No wound leaks were noted.  Protective glasses were placed on the patient.  The patient was taken to the recovery room in stable condition without complications of anesthesia or surgery   Benay Pillow 01/31/2020, 10:37 AM

## 2020-01-31 NOTE — Transfer of Care (Signed)
Immediate Anesthesia Transfer of Care Note  Patient: Scott Miller  Procedure(s) Performed: CATARACT EXTRACTION PHACO AND INTRAOCULAR LENS PLACEMENT (IOC) LEFT ISTENT INJ (Left Eye)  Patient Location: PACU  Anesthesia Type: MAC  Level of Consciousness: awake, alert  and patient cooperative  Airway and Oxygen Therapy: Patient Spontanous Breathing and Patient connected to supplemental oxygen  Post-op Assessment: Post-op Vital signs reviewed, Patient's Cardiovascular Status Stable, Respiratory Function Stable, Patent Airway and No signs of Nausea or vomiting  Post-op Vital Signs: Reviewed and stable  Complications: No complications documented.

## 2020-01-31 NOTE — Anesthesia Preprocedure Evaluation (Signed)
Anesthesia Evaluation  Patient identified by MRN, date of birth, ID band Patient awake    Reviewed: Allergy & Precautions, H&P , NPO status , Patient's Chart, lab work & pertinent test results  Airway Mallampati: III  TM Distance: >3 FB Neck ROM: full    Dental no notable dental hx. (+) Edentulous Upper, Missing, Chipped, Poor Dentition   Pulmonary former smoker,    Pulmonary exam normal breath sounds clear to auscultation       Cardiovascular hypertension, + Peripheral Vascular Disease  Normal cardiovascular exam+ Valvular Problems/Murmurs AS  Rhythm:regular Rate:Normal + Systolic murmurs Last Echo showed severe AS.  Monitoring progression.   Neuro/Psych CVA    GI/Hepatic   Endo/Other    Renal/GU Renal disease     Musculoskeletal   Abdominal   Peds  Hematology   Anesthesia Other Findings   Reproductive/Obstetrics                             Anesthesia Physical  Anesthesia Plan  ASA: IV  Anesthesia Plan: MAC   Post-op Pain Management:    Induction:   PONV Risk Score and Plan: 1 and Treatment may vary due to age or medical condition, TIVA and Midazolam  Airway Management Planned:   Additional Equipment:   Intra-op Plan:   Post-operative Plan:   Informed Consent: I have reviewed the patients History and Physical, chart, labs and discussed the procedure including the risks, benefits and alternatives for the proposed anesthesia with the patient or authorized representative who has indicated his/her understanding and acceptance.     Dental Advisory Given  Plan Discussed with: CRNA  Anesthesia Plan Comments:         Anesthesia Quick Evaluation

## 2020-01-31 NOTE — H&P (Signed)
[facility]   Primary Care Physician:  Garry Heater, MD Ophthalmologist: Dr. Willey Blade  Pre-Procedure History & Physical: HPI:  Scott Miller is a 75 y.o. male here for cataract surgery.   Past Medical History:  Diagnosis Date  . Aortic stenosis   . CKD (chronic kidney disease), stage III (HCC)   . Heart murmur   . Hyperlipidemia   . Hypertension   . Peripheral arterial disease (HCC)   . Stroke [facility]) 2014    Past Surgical History:  Procedure Laterality Date  . BLEPHAROPLASTY    . CATARACT EXTRACTION W/PHACO Right 11/29/2019   Procedure: CATARACT EXTRACTION PHACO AND INTRAOCULAR LENS PLACEMENT (IOC) RIGHT ISTENT INJ 3.51 00:34.6;  Surgeon: Nevada Crane, MD;  Location: [facility];  Service: Ophthalmology;  Laterality: Right;  . COLONOSCOPY    . FEMORAL BYPASS Left   . NECK SURGERY      Prior to Admission medications   Medication Sig Start Date End Date Taking? Authorizing Provider  amLODipine (NORVASC) 5 MG tablet Take 5 mg by mouth daily.   Yes [provider]  ASPIRIN 81 PO Take by mouth daily.   Yes [provider]  atorvastatin (LIPITOR) 80 MG tablet Take 80 mg by mouth daily.   Yes [provider]  carvedilol (COREG) 6.25 MG tablet Take 6.25 mg by mouth 2 (two) times daily with a meal.   Yes [provider]  Cholecalciferol (VITAMIN D) 125 MCG (5000 UT) CAPS Take by mouth daily.   Yes [provider]  cilostazol (PLETAL) 100 MG tablet Take 100 mg by mouth 2 (two) times daily.   Yes [provider]  losartan (COZAAR) 100 MG tablet Take 100 mg by mouth daily.   Yes [provider]    Allergies as of 12/01/2019 - Review Complete 11/29/2019  Allergen Reaction Noted  . Contrast media [iodinated diagnostic agents]  11/24/2019    History reviewed. No pertinent family history.  Social History   Socioeconomic History  . Marital status: Married    Spouse name: Not on file  . Number  of children: Not on file  . Years of education: Not on file  . Highest education level: Not on file  Occupational History  . Not on file  Tobacco Use  . Smoking status: Former Smoker    Types: Cigarettes    Quit date: 2014    Years since quitting: 8.0  . Smokeless tobacco: Never Used  Vaping Use  . Vaping Use: Never used  Substance and Sexual Activity  . Alcohol use: Not Currently  . Drug use: Not on file  . Sexual activity: Not on file  Other Topics Concern  . Not on file  Social History Narrative  . Not on file   Social Determinants of Health   Financial Resource Strain: Not on file  Food Insecurity: Not on file  Transportation Needs: Not on file  Physical Activity: Not on file  Stress: Not on file  Social Connections: Not on file  Intimate Partner Violence: Not on file    Review of Systems: See HPI, otherwise negative ROS  Physical Exam: BP (!) 165/82   Pulse 77   Temp (!) 97.3 F (36.3 C) (Temporal)   Resp 18   Ht 5\' 8"  (1.727 m)   Wt 96.2 kg   SpO2 96%   BMI 32.23 kg/m  General:   Alert,  pleasant and cooperative in NAD Head:  Normocephalic and atraumatic. Respiratory:  Normal  work of breathing.  Impression/Plan: Scott Miller is here for cataract surgery.  Risks, benefits, limitations, and alternatives regarding cataract surgery have been reviewed with the patient.  Questions have been answered.  All parties agreeable.   Willey Blade, MD  01/31/2020, 10:09 AM

## 2020-01-31 NOTE — Anesthesia Procedure Notes (Signed)
Procedure Name: MAC Date/Time: 01/31/2020 10:15 AM Performed by: Jeannene Patella, CRNA Pre-anesthesia Checklist: Patient identified, Emergency Drugs available, Suction available, Timeout performed and Patient being monitored Patient Re-evaluated:Patient Re-evaluated prior to induction Oxygen Delivery Method: Nasal cannula Placement Confirmation: positive ETCO2

## 2020-01-31 NOTE — Anesthesia Postprocedure Evaluation (Signed)
Anesthesia Post Note  Patient: Corporate treasurer  Procedure(s) Performed: CATARACT EXTRACTION PHACO AND INTRAOCULAR LENS PLACEMENT (IOC) LEFT ISTENT INJ (Left Eye)     Patient location during evaluation: PACU Anesthesia Type: MAC Level of consciousness: awake and alert and oriented Pain management: satisfactory to patient Vital Signs Assessment: post-procedure vital signs reviewed and stable Respiratory status: spontaneous breathing, nonlabored ventilation and respiratory function stable Cardiovascular status: blood pressure returned to baseline and stable Postop Assessment: Adequate PO intake and No signs of nausea or vomiting Anesthetic complications: no   No complications documented.  Raliegh Ip

## 2020-02-01 ENCOUNTER — Encounter: Payer: Self-pay | Admitting: Ophthalmology
